# Patient Record
Sex: Male | Born: 1950 | Race: White | Hispanic: No
Health system: Southern US, Community
[De-identification: ages and names within clinical notes are randomized; demographics above are authoritative.]

---

## 2008-08-06 ENCOUNTER — Ambulatory Visit: Payer: Self-pay

## 2011-04-26 ENCOUNTER — Emergency Department: Payer: Self-pay | Admitting: Internal Medicine

## 2011-06-03 ENCOUNTER — Ambulatory Visit: Payer: Self-pay

## 2011-06-23 ENCOUNTER — Ambulatory Visit: Payer: Self-pay | Admitting: Internal Medicine

## 2011-06-28 ENCOUNTER — Ambulatory Visit: Payer: Self-pay | Admitting: Internal Medicine

## 2011-07-07 ENCOUNTER — Ambulatory Visit: Payer: Self-pay | Admitting: Internal Medicine

## 2011-07-19 ENCOUNTER — Ambulatory Visit: Payer: Self-pay | Admitting: Internal Medicine

## 2011-07-19 LAB — PLATELET COUNT: Platelet: 249 10*3/uL (ref 150–440)

## 2011-07-19 LAB — APTT: Activated PTT: 27.3 secs (ref 23.6–35.9)

## 2011-07-19 LAB — PROTIME-INR: Prothrombin Time: 13 secs (ref 11.5–14.7)

## 2011-07-27 LAB — PATHOLOGY REPORT

## 2011-08-03 ENCOUNTER — Ambulatory Visit: Payer: Self-pay | Admitting: Oncology

## 2011-08-03 LAB — CBC CANCER CENTER
Basophil #: 0 x10 3/mm (ref 0.0–0.1)
Eosinophil #: 0 x10 3/mm (ref 0.0–0.7)
Eosinophil %: 0.1 %
Lymphocyte %: 9.6 %
MCHC: 34.7 g/dL (ref 32.0–36.0)
MCV: 99 fL (ref 80–100)
Monocyte %: 8.3 %
Neutrophil #: 9.2 x10 3/mm — ABNORMAL HIGH (ref 1.4–6.5)
Neutrophil %: 81.7 %
Platelet: 288 x10 3/mm (ref 150–440)
RBC: 3.35 10*6/uL — ABNORMAL LOW (ref 4.40–5.90)
RDW: 14.1 % (ref 11.5–14.5)
WBC: 11.2 x10 3/mm — ABNORMAL HIGH (ref 3.8–10.6)

## 2011-08-03 LAB — COMPREHENSIVE METABOLIC PANEL
Albumin: 2.3 g/dL — ABNORMAL LOW (ref 3.4–5.0)
Alkaline Phosphatase: 1391 U/L — ABNORMAL HIGH (ref 50–136)
BUN: 17 mg/dL (ref 7–18)
Bilirubin,Total: 2.8 mg/dL — ABNORMAL HIGH (ref 0.2–1.0)
Chloride: 90 mmol/L — ABNORMAL LOW (ref 98–107)
Co2: 29 mmol/L (ref 21–32)
Creatinine: 1.03 mg/dL (ref 0.60–1.30)
EGFR (African American): >60
Glucose: 101 mg/dL — ABNORMAL HIGH (ref 65–99)
SGPT (ALT): 64 U/L
Total Protein: 7.7 g/dL (ref 6.4–8.2)

## 2011-08-03 LAB — PROTIME-INR: Prothrombin Time: 13.5 secs (ref 11.5–14.7)

## 2011-08-11 LAB — CBC CANCER CENTER
Bands: 3 %
Basophil #: 0 x10 3/mm (ref 0.0–0.1)
Eosinophil #: 0 x10 3/mm (ref 0.0–0.7)
Eosinophil %: 0.2 %
Eosinophil: 1 %
Lymphocyte #: 0.9 x10 3/mm — ABNORMAL LOW (ref 1.0–3.6)
Lymphocytes: 23 %
MCH: 34.3 pg — ABNORMAL HIGH (ref 26.0–34.0)
MCHC: 34.9 g/dL (ref 32.0–36.0)
MCV: 98 fL (ref 80–100)
Monocyte #: 0.1 x10 3/mm (ref 0.0–0.7)
Monocyte %: 1.7 %
Neutrophil %: 79.3 %
Platelet: 92 x10 3/mm — ABNORMAL LOW (ref 150–440)
RBC: 2.83 10*6/uL — ABNORMAL LOW (ref 4.40–5.90)
RDW: 14.5 % (ref 11.5–14.5)
WBC: 4.8 x10 3/mm (ref 3.8–10.6)

## 2011-08-11 LAB — COMPREHENSIVE METABOLIC PANEL
Albumin: 2.1 g/dL — ABNORMAL LOW (ref 3.4–5.0)
Alkaline Phosphatase: 1229 U/L — ABNORMAL HIGH (ref 50–136)
Anion Gap: 4 — ABNORMAL LOW (ref 7–16)
BUN: 28 mg/dL — ABNORMAL HIGH (ref 7–18)
Bilirubin,Total: 3.3 mg/dL — ABNORMAL HIGH (ref 0.2–1.0)
Calcium, Total: 8.4 mg/dL — ABNORMAL LOW (ref 8.5–10.1)
Creatinine: 0.81 mg/dL (ref 0.60–1.30)
Osmolality: 264 (ref 275–301)
Potassium: 4.9 mmol/L (ref 3.5–5.1)
Sodium: 129 mmol/L — ABNORMAL LOW (ref 136–145)
Total Protein: 6.9 g/dL (ref 6.4–8.2)

## 2011-08-18 LAB — CBC CANCER CENTER
Basophil %: 0.1 %
Eosinophil #: 0 x10 3/mm (ref 0.0–0.7)
Eosinophil %: 0 %
HCT: 24.7 % — ABNORMAL LOW (ref 40.0–52.0)
HGB: 8.4 g/dL — ABNORMAL LOW (ref 13.0–18.0)
MCHC: 34.2 g/dL (ref 32.0–36.0)
MCV: 100 fL (ref 80–100)
Platelet: 57 x10 3/mm — ABNORMAL LOW (ref 150–440)
RBC: 2.47 10*6/uL — ABNORMAL LOW (ref 4.40–5.90)
WBC: 25.8 x10 3/mm — ABNORMAL HIGH (ref 3.8–10.6)

## 2011-08-18 LAB — COMPREHENSIVE METABOLIC PANEL
Albumin: 1.9 g/dL — ABNORMAL LOW (ref 3.4–5.0)
Anion Gap: 10 (ref 7–16)
BUN: 51 mg/dL — ABNORMAL HIGH (ref 7–18)
Calcium, Total: 8.1 mg/dL — ABNORMAL LOW (ref 8.5–10.1)
Chloride: 98 mmol/L (ref 98–107)
Co2: 29 mmol/L (ref 21–32)
Creatinine: 1.31 mg/dL — ABNORMAL HIGH (ref 0.60–1.30)
EGFR (African American): >60
Osmolality: 290 (ref 275–301)
Potassium: 3.9 mmol/L (ref 3.5–5.1)
SGOT(AST): 100 U/L — ABNORMAL HIGH (ref 15–37)
SGPT (ALT): 64 U/L
Sodium: 137 mmol/L (ref 136–145)
Total Protein: 6.7 g/dL (ref 6.4–8.2)

## 2011-08-24 LAB — CBC CANCER CENTER
Basophil #: 0.2 x10 3/mm — ABNORMAL HIGH (ref 0.0–0.1)
Basophil %: 1.1 %
Eosinophil #: 0 x10 3/mm (ref 0.0–0.7)
HCT: 30.2 % — ABNORMAL LOW (ref 40.0–52.0)
HGB: 10.2 g/dL — ABNORMAL LOW (ref 13.0–18.0)
Lymphocyte %: 5.8 %
MCHC: 33.9 g/dL (ref 32.0–36.0)
MCV: 98 fL (ref 80–100)
Monocyte %: 4.2 %
Neutrophil %: 88.9 %
Platelet: 455 x10 3/mm — ABNORMAL HIGH (ref 150–440)
RDW: 15.2 % — ABNORMAL HIGH (ref 11.5–14.5)

## 2011-08-24 LAB — COMPREHENSIVE METABOLIC PANEL
Albumin: 1.9 g/dL — ABNORMAL LOW (ref 3.4–5.0)
Alkaline Phosphatase: 1216 U/L — ABNORMAL HIGH (ref 50–136)
Anion Gap: 7 (ref 7–16)
BUN: 14 mg/dL (ref 7–18)
Chloride: 98 mmol/L (ref 98–107)
Co2: 31 mmol/L (ref 21–32)
Creatinine: 0.81 mg/dL (ref 0.60–1.30)
EGFR (African American): >60
EGFR (Non-African Amer.): >60
Glucose: 112 mg/dL — ABNORMAL HIGH (ref 65–99)
Osmolality: 273 (ref 275–301)
SGOT(AST): 82 U/L — ABNORMAL HIGH (ref 15–37)
SGPT (ALT): 48 U/L
Sodium: 136 mmol/L (ref 136–145)

## 2011-08-30 ENCOUNTER — Ambulatory Visit: Payer: Self-pay | Admitting: Oncology

## 2011-08-31 LAB — CBC CANCER CENTER
Basophil #: 0.1 x10 3/mm (ref 0.0–0.1)
Basophil %: 0.6 %
Eosinophil #: 0 x10 3/mm (ref 0.0–0.7)
HCT: 26.5 % — ABNORMAL LOW (ref 40.0–52.0)
HGB: 9.1 g/dL — ABNORMAL LOW (ref 13.0–18.0)
Lymphocyte #: 0.4 x10 3/mm — ABNORMAL LOW (ref 1.0–3.6)
Lymphocyte %: 4.1 %
MCH: 33.9 pg (ref 26.0–34.0)
MCHC: 34.4 g/dL (ref 32.0–36.0)
Monocyte #: 0.3 x10 3/mm (ref 0.0–0.7)
Monocyte %: 3.1 %
Neutrophil #: 8.6 x10 3/mm — ABNORMAL HIGH (ref 1.4–6.5)
Neutrophil %: 92.2 %
RBC: 2.68 10*6/uL — ABNORMAL LOW (ref 4.40–5.90)
RDW: 14.9 % — ABNORMAL HIGH (ref 11.5–14.5)

## 2011-08-31 LAB — COMPREHENSIVE METABOLIC PANEL
Albumin: 1.8 g/dL — ABNORMAL LOW (ref 3.4–5.0)
Alkaline Phosphatase: 977 U/L — ABNORMAL HIGH (ref 50–136)
BUN: 18 mg/dL (ref 7–18)
Bilirubin,Total: 1.6 mg/dL — ABNORMAL HIGH (ref 0.2–1.0)
Calcium, Total: 8 mg/dL — ABNORMAL LOW (ref 8.5–10.1)
Chloride: 102 mmol/L (ref 98–107)
EGFR (African American): >60
EGFR (Non-African Amer.): >60
Glucose: 154 mg/dL — ABNORMAL HIGH (ref 65–99)
Osmolality: 284 (ref 275–301)
SGOT(AST): 83 U/L — ABNORMAL HIGH (ref 15–37)
SGPT (ALT): 57 U/L
Sodium: 140 mmol/L (ref 136–145)
Total Protein: 6.7 g/dL (ref 6.4–8.2)

## 2011-09-07 LAB — CBC CANCER CENTER
Basophil #: 0 x10 3/mm (ref 0.0–0.1)
Basophil %: 0 %
Eosinophil #: 0 x10 3/mm (ref 0.0–0.7)
HCT: 23.5 % — ABNORMAL LOW (ref 40.0–52.0)
HGB: 8 g/dL — ABNORMAL LOW (ref 13.0–18.0)
MCH: 34 pg (ref 26.0–34.0)
MCHC: 34.3 g/dL (ref 32.0–36.0)
MCV: 99 fL (ref 80–100)
Monocyte #: 0.5 x10 3/mm (ref 0.0–0.7)
Monocyte %: 2.6 %
Platelet: 68 x10 3/mm — ABNORMAL LOW (ref 150–440)
WBC: 19.3 x10 3/mm — ABNORMAL HIGH (ref 3.8–10.6)

## 2011-09-14 LAB — CBC CANCER CENTER
Basophil #: 0 x10 3/mm (ref 0.0–0.1)
Basophil %: 0.2 %
Eosinophil #: 0 x10 3/mm (ref 0.0–0.7)
Eosinophil %: 0.1 %
HCT: 27.6 % — ABNORMAL LOW (ref 40.0–52.0)
Lymphocyte #: 1.5 x10 3/mm (ref 1.0–3.6)
MCH: 32.7 pg (ref 26.0–34.0)
MCHC: 32.1 g/dL (ref 32.0–36.0)
MCV: 102 fL — ABNORMAL HIGH (ref 80–100)
Monocyte %: 7.5 %
Platelet: 561 x10 3/mm — ABNORMAL HIGH (ref 150–440)
WBC: 15.4 x10 3/mm — ABNORMAL HIGH (ref 3.8–10.6)

## 2011-09-14 LAB — COMPREHENSIVE METABOLIC PANEL
Albumin: 1.9 g/dL — ABNORMAL LOW (ref 3.4–5.0)
Bilirubin,Total: 0.5 mg/dL (ref 0.2–1.0)
Calcium, Total: 7.7 mg/dL — ABNORMAL LOW (ref 8.5–10.1)
Chloride: 103 mmol/L (ref 98–107)
Co2: 31 mmol/L (ref 21–32)
Creatinine: 0.81 mg/dL (ref 0.60–1.30)
EGFR (African American): >60
Potassium: 3.5 mmol/L (ref 3.5–5.1)
Sodium: 142 mmol/L (ref 136–145)
Total Protein: 6.9 g/dL (ref 6.4–8.2)

## 2011-09-21 LAB — COMPREHENSIVE METABOLIC PANEL
Alkaline Phosphatase: 531 U/L — ABNORMAL HIGH (ref 50–136)
Anion Gap: 7 (ref 7–16)
BUN: 13 mg/dL (ref 7–18)
Bilirubin,Total: 1.1 mg/dL — ABNORMAL HIGH (ref 0.2–1.0)
Calcium, Total: 8.1 mg/dL — ABNORMAL LOW (ref 8.5–10.1)
Co2: 34 mmol/L — ABNORMAL HIGH (ref 21–32)
Glucose: 134 mg/dL — ABNORMAL HIGH (ref 65–99)
Osmolality: 281 (ref 275–301)
Potassium: 3.8 mmol/L (ref 3.5–5.1)
SGOT(AST): 56 U/L — ABNORMAL HIGH (ref 15–37)

## 2011-09-21 LAB — CBC CANCER CENTER
Basophil #: 0.1 x10 3/mm (ref 0.0–0.1)
Eosinophil %: 0.1 %
Lymphocyte %: 2.6 %
MCH: 33.8 pg (ref 26.0–34.0)
MCV: 103 fL — ABNORMAL HIGH (ref 80–100)
Neutrophil #: 23.2 x10 3/mm — ABNORMAL HIGH (ref 1.4–6.5)
Neutrophil %: 94.8 %
Platelet: 139 x10 3/mm — ABNORMAL LOW (ref 150–440)
RBC: 2.35 10*6/uL — ABNORMAL LOW (ref 4.40–5.90)

## 2011-09-28 LAB — CBC CANCER CENTER
Basophil %: 0.7 %
Eosinophil #: 0 x10 3/mm (ref 0.0–0.7)
Eosinophil %: 0 %
HGB: 8.8 g/dL — ABNORMAL LOW (ref 13.0–18.0)
Lymphocyte #: 1 x10 3/mm (ref 1.0–3.6)
Lymphocyte %: 8.2 %
Monocyte #: 0.7 x10 3/mm (ref 0.2–1.0)
Monocyte %: 5.9 %
Neutrophil #: 10.5 x10 3/mm — ABNORMAL HIGH (ref 1.4–6.5)
Neutrophil %: 85.2 %
Platelet: 42 x10 3/mm — ABNORMAL LOW (ref 150–440)
RBC: 2.64 10*6/uL — ABNORMAL LOW (ref 4.40–5.90)
RDW: 21.9 % — ABNORMAL HIGH (ref 11.5–14.5)

## 2011-09-29 ENCOUNTER — Ambulatory Visit: Payer: Self-pay | Admitting: Oncology

## 2011-10-06 LAB — COMPREHENSIVE METABOLIC PANEL
Alkaline Phosphatase: 375 U/L — ABNORMAL HIGH (ref 50–136)
Anion Gap: 5 — ABNORMAL LOW (ref 7–16)
BUN: 9 mg/dL (ref 7–18)
Calcium, Total: 7.7 mg/dL — ABNORMAL LOW (ref 8.5–10.1)
Co2: 35 mmol/L — ABNORMAL HIGH (ref 21–32)
EGFR (Non-African Amer.): >60
Osmolality: 280 (ref 275–301)
SGOT(AST): 57 U/L — ABNORMAL HIGH (ref 15–37)
SGPT (ALT): 32 U/L

## 2011-10-06 LAB — CBC CANCER CENTER
Basophil %: 0.2 %
Eosinophil #: 0 x10 3/mm (ref 0.0–0.7)
HGB: 9.4 g/dL — ABNORMAL LOW (ref 13.0–18.0)
Lymphocyte %: 7 %
MCH: 34.2 pg — ABNORMAL HIGH (ref 26.0–34.0)
MCHC: 32.5 g/dL (ref 32.0–36.0)
Monocyte #: 0.5 x10 3/mm (ref 0.2–1.0)
RBC: 2.76 10*6/uL — ABNORMAL LOW (ref 4.40–5.90)
RDW: 24.1 % — ABNORMAL HIGH (ref 11.5–14.5)

## 2011-10-13 LAB — CBC CANCER CENTER
Eosinophil #: 0 x10 3/mm (ref 0.0–0.7)
HCT: 22.5 % — ABNORMAL LOW (ref 40.0–52.0)
Lymphocyte %: 7.2 %
MCH: 35.5 pg — ABNORMAL HIGH (ref 26.0–34.0)
MCHC: 32.9 g/dL (ref 32.0–36.0)
Monocyte #: 0.2 x10 3/mm (ref 0.2–1.0)
Monocyte %: 2.5 %
Neutrophil #: 9 x10 3/mm — ABNORMAL HIGH (ref 1.4–6.5)
RDW: 23.1 % — ABNORMAL HIGH (ref 11.5–14.5)

## 2011-10-20 LAB — CBC CANCER CENTER
Basophil #: 0 x10 3/mm (ref 0.0–0.1)
Basophil %: 0.6 %
Eosinophil #: 0 x10 3/mm (ref 0.0–0.7)
Eosinophil %: 0.1 %
HCT: 27.1 % — ABNORMAL LOW (ref 40.0–52.0)
HGB: 8.9 g/dL — ABNORMAL LOW (ref 13.0–18.0)
Lymphocyte %: 16.5 %
MCHC: 32.9 g/dL (ref 32.0–36.0)
Monocyte #: 0.2 x10 3/mm (ref 0.2–1.0)
Monocyte %: 5.9 %
Neutrophil #: 2.3 x10 3/mm (ref 1.4–6.5)
Platelet: 8 x10 3/mm — CL (ref 150–440)
RDW: 20.6 % — ABNORMAL HIGH (ref 11.5–14.5)

## 2011-10-22 LAB — CBC CANCER CENTER
Basophil #: 0.1 x10 3/mm (ref 0.0–0.1)
Basophil %: 0.9 %
Eosinophil %: 0 %
HCT: 30.3 % — ABNORMAL LOW (ref 40.0–52.0)
Lymphocyte %: 18.3 %
MCHC: 33 g/dL (ref 32.0–36.0)
MCV: 105 fL — ABNORMAL HIGH (ref 80–100)
Monocyte %: 7.5 %
Neutrophil #: 4.4 x10 3/mm (ref 1.4–6.5)
Platelet: 75 x10 3/mm — ABNORMAL LOW (ref 150–440)
RBC: 2.89 10*6/uL — ABNORMAL LOW (ref 4.40–5.90)

## 2011-10-22 LAB — COMPREHENSIVE METABOLIC PANEL
Albumin: 2.5 g/dL — ABNORMAL LOW (ref 3.4–5.0)
Alkaline Phosphatase: 261 U/L — ABNORMAL HIGH (ref 50–136)
Bilirubin,Total: 0.6 mg/dL (ref 0.2–1.0)
Chloride: 98 mmol/L (ref 98–107)
Co2: 34 mmol/L — ABNORMAL HIGH (ref 21–32)
Creatinine: 0.63 mg/dL (ref 0.60–1.30)
EGFR (African American): >60
EGFR (Non-African Amer.): >60
Osmolality: 277 (ref 275–301)
Potassium: 3.7 mmol/L (ref 3.5–5.1)
SGPT (ALT): 34 U/L
Sodium: 139 mmol/L (ref 136–145)

## 2011-10-22 LAB — PROTIME-INR: INR: 1

## 2011-10-22 LAB — APTT: Activated PTT: 26.4 secs (ref 23.6–35.9)

## 2011-10-25 ENCOUNTER — Ambulatory Visit: Payer: Self-pay | Admitting: Oncology

## 2011-10-27 LAB — CBC CANCER CENTER
Basophil #: 0 x10 3/mm (ref 0.0–0.1)
Basophil %: 0.3 %
Eosinophil %: 0.1 %
HGB: 9.8 g/dL — ABNORMAL LOW (ref 13.0–18.0)
MCH: 35.3 pg — ABNORMAL HIGH (ref 26.0–34.0)
MCHC: 33.3 g/dL (ref 32.0–36.0)
MCV: 106 fL — ABNORMAL HIGH (ref 80–100)
Monocyte %: 9.9 %
Neutrophil #: 5.6 x10 3/mm (ref 1.4–6.5)
Neutrophil %: 77.3 %
Platelet: 152 x10 3/mm (ref 150–440)
RBC: 2.77 10*6/uL — ABNORMAL LOW (ref 4.40–5.90)
RDW: 21.5 % — ABNORMAL HIGH (ref 11.5–14.5)
WBC: 7.2 x10 3/mm (ref 3.8–10.6)

## 2011-10-30 ENCOUNTER — Ambulatory Visit: Payer: Self-pay | Admitting: Oncology

## 2011-11-03 LAB — CBC CANCER CENTER
Basophil #: 0 x10 3/mm (ref 0.0–0.1)
Eosinophil #: 0 x10 3/mm (ref 0.0–0.7)
Eosinophil %: 0 %
HCT: 27.9 % — ABNORMAL LOW (ref 40.0–52.0)
HGB: 9.1 g/dL — ABNORMAL LOW (ref 13.0–18.0)
Lymphocyte #: 0.6 x10 3/mm — ABNORMAL LOW (ref 1.0–3.6)
Lymphocyte %: 2.8 %
MCH: 34.3 pg — ABNORMAL HIGH (ref 26.0–34.0)
MCHC: 32.7 g/dL (ref 32.0–36.0)
Monocyte %: 6.5 %
Neutrophil #: 19.2 x10 3/mm — ABNORMAL HIGH (ref 1.4–6.5)
Neutrophil %: 90.6 %
Platelet: 254 x10 3/mm (ref 150–440)
RBC: 2.66 10*6/uL — ABNORMAL LOW (ref 4.40–5.90)
RDW: 21 % — ABNORMAL HIGH (ref 11.5–14.5)
WBC: 21.2 x10 3/mm — ABNORMAL HIGH (ref 3.8–10.6)

## 2011-11-10 LAB — CBC CANCER CENTER
Basophil #: 0.1 x10 3/mm (ref 0.0–0.1)
Basophil %: 0.4 %
Eosinophil #: 0 x10 3/mm (ref 0.0–0.7)
Eosinophil %: 0 %
HGB: 10.2 g/dL — ABNORMAL LOW (ref 13.0–18.0)
Lymphocyte #: 0.8 x10 3/mm — ABNORMAL LOW (ref 1.0–3.6)
Lymphocyte %: 5.6 %
MCH: 35.1 pg — ABNORMAL HIGH (ref 26.0–34.0)
MCHC: 32.9 g/dL (ref 32.0–36.0)
Monocyte #: 0.9 x10 3/mm (ref 0.2–1.0)
Monocyte %: 6.1 %
Neutrophil #: 12.9 x10 3/mm — ABNORMAL HIGH (ref 1.4–6.5)
Neutrophil %: 87.9 %
WBC: 14.7 x10 3/mm — ABNORMAL HIGH (ref 3.8–10.6)

## 2011-11-17 LAB — CBC CANCER CENTER
Eosinophil #: 0 x10 3/mm (ref 0.0–0.7)
HCT: 31.5 % — ABNORMAL LOW (ref 40.0–52.0)
HGB: 10.3 g/dL — ABNORMAL LOW (ref 13.0–18.0)
MCH: 35 pg — ABNORMAL HIGH (ref 26.0–34.0)
MCV: 107 fL — ABNORMAL HIGH (ref 80–100)
Monocyte #: 0.6 x10 3/mm (ref 0.2–1.0)
Monocyte %: 5.3 %
Neutrophil %: 87.5 %
Platelet: 133 x10 3/mm — ABNORMAL LOW (ref 150–440)

## 2011-11-17 LAB — COMPREHENSIVE METABOLIC PANEL
Anion Gap: 5 — ABNORMAL LOW (ref 7–16)
BUN: 10 mg/dL (ref 7–18)
Chloride: 98 mmol/L (ref 98–107)
Co2: 33 mmol/L — ABNORMAL HIGH (ref 21–32)
EGFR (African American): >60
EGFR (Non-African Amer.): >60
Glucose: 135 mg/dL — ABNORMAL HIGH (ref 65–99)
Osmolality: 273 (ref 275–301)
Potassium: 3.3 mmol/L — ABNORMAL LOW (ref 3.5–5.1)
SGOT(AST): 36 U/L (ref 15–37)
SGPT (ALT): 22 U/L
Total Protein: 7.6 g/dL (ref 6.4–8.2)

## 2011-11-24 LAB — CBC CANCER CENTER
Eosinophil #: 0 x10 3/mm (ref 0.0–0.7)
HCT: 29.3 % — ABNORMAL LOW (ref 40.0–52.0)
HGB: 9.6 g/dL — ABNORMAL LOW (ref 13.0–18.0)
Lymphocyte #: 0.8 x10 3/mm — ABNORMAL LOW (ref 1.0–3.6)
Lymphocyte %: 2.5 %
MCH: 35.1 pg — ABNORMAL HIGH (ref 26.0–34.0)
Monocyte #: 0.7 x10 3/mm (ref 0.2–1.0)
Monocyte %: 2.2 %
Platelet: 64 x10 3/mm — ABNORMAL LOW (ref 150–440)

## 2011-11-29 ENCOUNTER — Ambulatory Visit: Payer: Self-pay | Admitting: Oncology

## 2011-12-01 LAB — CBC CANCER CENTER
Basophil #: 0 x10 3/mm (ref 0.0–0.1)
Basophil %: 0.4 %
HCT: 28.3 % — ABNORMAL LOW (ref 40.0–52.0)
HGB: 9.3 g/dL — ABNORMAL LOW (ref 13.0–18.0)
Lymphocyte #: 1 x10 3/mm (ref 1.0–3.6)
Lymphocyte %: 10.7 %
MCHC: 32.8 g/dL (ref 32.0–36.0)
MCV: 110 fL — ABNORMAL HIGH (ref 80–100)
Monocyte #: 0.3 x10 3/mm (ref 0.2–1.0)
Monocyte %: 2.7 %
Neutrophil #: 8.1 x10 3/mm — ABNORMAL HIGH (ref 1.4–6.5)
RBC: 2.58 10*6/uL — ABNORMAL LOW (ref 4.40–5.90)
RDW: 19.8 % — ABNORMAL HIGH (ref 11.5–14.5)
WBC: 9.3 x10 3/mm (ref 3.8–10.6)

## 2011-12-01 LAB — BASIC METABOLIC PANEL
Anion Gap: 10 (ref 7–16)
Calcium, Total: 8 mg/dL — ABNORMAL LOW (ref 8.5–10.1)
Chloride: 94 mmol/L — ABNORMAL LOW (ref 98–107)
Co2: 33 mmol/L — ABNORMAL HIGH (ref 21–32)
Creatinine: 0.95 mg/dL (ref 0.60–1.30)
EGFR (African American): >60
EGFR (Non-African Amer.): >60
Glucose: 111 mg/dL — ABNORMAL HIGH (ref 65–99)
Sodium: 137 mmol/L (ref 136–145)

## 2011-12-01 LAB — MAGNESIUM: Magnesium: 1 mg/dL — ABNORMAL LOW

## 2011-12-08 ENCOUNTER — Ambulatory Visit: Payer: Self-pay | Admitting: Oncology

## 2011-12-08 LAB — CBC CANCER CENTER
Basophil #: 0.1 x10 3/mm (ref 0.0–0.1)
Basophil %: 0.6 %
Eosinophil %: 0.1 %
HCT: 30 % — ABNORMAL LOW (ref 40.0–52.0)
MCH: 36 pg — ABNORMAL HIGH (ref 26.0–34.0)
MCHC: 32.1 g/dL (ref 32.0–36.0)
Monocyte #: 0.8 x10 3/mm (ref 0.2–1.0)
Monocyte %: 6.6 %
Neutrophil #: 9.9 x10 3/mm — ABNORMAL HIGH (ref 1.4–6.5)
Neutrophil %: 83.9 %
Platelet: 164 x10 3/mm (ref 150–440)
RBC: 2.67 10*6/uL — ABNORMAL LOW (ref 4.40–5.90)
RDW: 20.3 % — ABNORMAL HIGH (ref 11.5–14.5)

## 2011-12-08 LAB — COMPREHENSIVE METABOLIC PANEL
Anion Gap: 6 — ABNORMAL LOW (ref 7–16)
BUN: 11 mg/dL (ref 7–18)
Bilirubin,Total: 0.5 mg/dL (ref 0.2–1.0)
Calcium, Total: 8.5 mg/dL (ref 8.5–10.1)
Chloride: 96 mmol/L — ABNORMAL LOW (ref 98–107)
Co2: 33 mmol/L — ABNORMAL HIGH (ref 21–32)
Creatinine: 0.86 mg/dL (ref 0.60–1.30)
EGFR (African American): >60
EGFR (Non-African Amer.): >60
Osmolality: 269 (ref 275–301)
SGPT (ALT): 16 U/L
Total Protein: 8.1 g/dL (ref 6.4–8.2)

## 2011-12-08 LAB — APTT: Activated PTT: 27.1 secs (ref 23.6–35.9)

## 2011-12-15 LAB — CBC CANCER CENTER
Basophil #: 0 x10 3/mm (ref 0.0–0.1)
Basophil %: 0.5 %
Eosinophil #: 0 x10 3/mm (ref 0.0–0.7)
HCT: 35.2 % — ABNORMAL LOW (ref 40.0–52.0)
HGB: 11.5 g/dL — ABNORMAL LOW (ref 13.0–18.0)
MCH: 36.4 pg — ABNORMAL HIGH (ref 26.0–34.0)
MCHC: 32.6 g/dL (ref 32.0–36.0)
MCV: 112 fL — ABNORMAL HIGH (ref 80–100)
Monocyte #: 1.2 x10 3/mm — ABNORMAL HIGH (ref 0.2–1.0)
Neutrophil #: 7.1 x10 3/mm — ABNORMAL HIGH (ref 1.4–6.5)
Neutrophil %: 73.5 %
RDW: 18.6 % — ABNORMAL HIGH (ref 11.5–14.5)

## 2011-12-15 LAB — COMPREHENSIVE METABOLIC PANEL
Alkaline Phosphatase: 221 U/L — ABNORMAL HIGH (ref 50–136)
Bilirubin,Total: 0.6 mg/dL (ref 0.2–1.0)
Calcium, Total: 8.2 mg/dL — ABNORMAL LOW (ref 8.5–10.1)
Chloride: 96 mmol/L — ABNORMAL LOW (ref 98–107)
Co2: 34 mmol/L — ABNORMAL HIGH (ref 21–32)
Creatinine: 0.89 mg/dL (ref 0.60–1.30)
EGFR (Non-African Amer.): >60
Osmolality: 270 (ref 275–301)
SGOT(AST): 40 U/L — ABNORMAL HIGH (ref 15–37)
Sodium: 135 mmol/L — ABNORMAL LOW (ref 136–145)

## 2011-12-21 ENCOUNTER — Inpatient Hospital Stay: Payer: Self-pay | Admitting: Oncology

## 2011-12-21 LAB — CBC CANCER CENTER
Basophil #: 0.1 x10 3/mm (ref 0.0–0.1)
Basophil %: 0.5 %
HCT: 30.9 % — ABNORMAL LOW (ref 40.0–52.0)
HGB: 10 g/dL — ABNORMAL LOW (ref 13.0–18.0)
Lymphocyte %: 13.1 %
MCHC: 32.4 g/dL (ref 32.0–36.0)
MCV: 110 fL — ABNORMAL HIGH (ref 80–100)
Monocyte #: 1.1 x10 3/mm — ABNORMAL HIGH (ref 0.2–1.0)
Monocyte %: 9.5 %
Neutrophil #: 8.6 x10 3/mm — ABNORMAL HIGH (ref 1.4–6.5)
Platelet: 372 x10 3/mm (ref 150–440)
RBC: 2.8 10*6/uL — ABNORMAL LOW (ref 4.40–5.90)

## 2011-12-21 LAB — APTT: Activated PTT: 26.4 secs (ref 23.6–35.9)

## 2011-12-21 LAB — COMPREHENSIVE METABOLIC PANEL
BUN: 17 mg/dL (ref 7–18)
Bilirubin,Total: 0.6 mg/dL (ref 0.2–1.0)
Chloride: 97 mmol/L — ABNORMAL LOW (ref 98–107)
Co2: 30 mmol/L (ref 21–32)
Creatinine: 0.93 mg/dL (ref 0.60–1.30)
EGFR (African American): >60
EGFR (Non-African Amer.): >60
Osmolality: 272 (ref 275–301)
Potassium: 3.1 mmol/L — ABNORMAL LOW (ref 3.5–5.1)
SGOT(AST): 32 U/L (ref 15–37)

## 2011-12-29 LAB — CBC CANCER CENTER
Basophil #: 0 x10 3/mm (ref 0.0–0.1)
HGB: 11.5 g/dL — ABNORMAL LOW (ref 13.0–18.0)
Lymphocyte #: 1.8 x10 3/mm (ref 1.0–3.6)
MCH: 37 pg — ABNORMAL HIGH (ref 26.0–34.0)
MCV: 111 fL — ABNORMAL HIGH (ref 80–100)
Monocyte #: 0.8 x10 3/mm (ref 0.2–1.0)
Monocyte %: 12.1 %
Neutrophil #: 3.8 x10 3/mm (ref 1.4–6.5)
Neutrophil %: 58.2 %
Platelet: 196 x10 3/mm (ref 150–440)
RBC: 3.1 10*6/uL — ABNORMAL LOW (ref 4.40–5.90)
RDW: 16.5 % — ABNORMAL HIGH (ref 11.5–14.5)
WBC: 6.5 x10 3/mm (ref 3.8–10.6)

## 2011-12-29 LAB — COMPREHENSIVE METABOLIC PANEL
Alkaline Phosphatase: 216 U/L — ABNORMAL HIGH (ref 50–136)
BUN: 15 mg/dL (ref 7–18)
Calcium, Total: 7.6 mg/dL — ABNORMAL LOW (ref 8.5–10.1)
Chloride: 98 mmol/L (ref 98–107)
Co2: 32 mmol/L (ref 21–32)
EGFR (African American): >60
EGFR (Non-African Amer.): >60
SGOT(AST): 58 U/L — ABNORMAL HIGH (ref 15–37)
SGPT (ALT): 29 U/L
Total Protein: 6.9 g/dL (ref 6.4–8.2)

## 2011-12-30 ENCOUNTER — Ambulatory Visit: Payer: Self-pay | Admitting: Oncology

## 2012-01-12 LAB — CBC CANCER CENTER
Basophil #: 0 x10 3/mm (ref 0.0–0.1)
Basophil %: 0.8 %
HCT: 38.4 % — ABNORMAL LOW (ref 40.0–52.0)
HGB: 12.5 g/dL — ABNORMAL LOW (ref 13.0–18.0)
Lymphocyte #: 1.6 x10 3/mm (ref 1.0–3.6)
Lymphocyte %: 25.8 %
MCV: 109 fL — ABNORMAL HIGH (ref 80–100)
Monocyte %: 10.2 %
Neutrophil #: 3.8 x10 3/mm (ref 1.4–6.5)
RBC: 3.53 10*6/uL — ABNORMAL LOW (ref 4.40–5.90)
RDW: 16.4 % — ABNORMAL HIGH (ref 11.5–14.5)
WBC: 6.1 x10 3/mm (ref 3.8–10.6)

## 2012-01-12 LAB — COMPREHENSIVE METABOLIC PANEL
Albumin: 2 g/dL — ABNORMAL LOW (ref 3.4–5.0)
Alkaline Phosphatase: 511 U/L — ABNORMAL HIGH (ref 50–136)
Anion Gap: 5 — ABNORMAL LOW (ref 7–16)
BUN: 16 mg/dL (ref 7–18)
Bilirubin,Total: 0.6 mg/dL (ref 0.2–1.0)
Calcium, Total: 7.9 mg/dL — ABNORMAL LOW (ref 8.5–10.1)
Creatinine: 0.93 mg/dL (ref 0.60–1.30)
Glucose: 93 mg/dL (ref 65–99)
Osmolality: 262 (ref 275–301)
Sodium: 130 mmol/L — ABNORMAL LOW (ref 136–145)
Total Protein: 8.1 g/dL (ref 6.4–8.2)

## 2012-01-30 ENCOUNTER — Ambulatory Visit: Payer: Self-pay | Admitting: Oncology

## 2012-01-30 DEATH — deceased

## 2013-07-19 IMAGING — CT CT ABDOMEN WO/W CM
2 of 4 series · 13 of 32 positions shown, 18 images · IV contrast (isovue)
Comparison: None

REASON FOR EXAM: echo showed liver mass
COMMENTS:

PROCEDURE:     KCT - KCT ABDOMEN STANDARD W/WO  - June 23, 2011 [DATE]
RESULT:     History: Liver mass
TECHNIQUE: Multiple axial images of the abdomen were performed from the lung
bases to the iliac crests, with p.o. contrast and with 85 mL of Isovue 370
intravenous contrast.

[Series 5: abd 3mm w 3.0 i40f 3 · axial · 0.78mm/px · z∈[-598,-370]mm · 8 of 98 slices shown, 13 images (1 of 2)]
[im 11/98  soft-tissue]
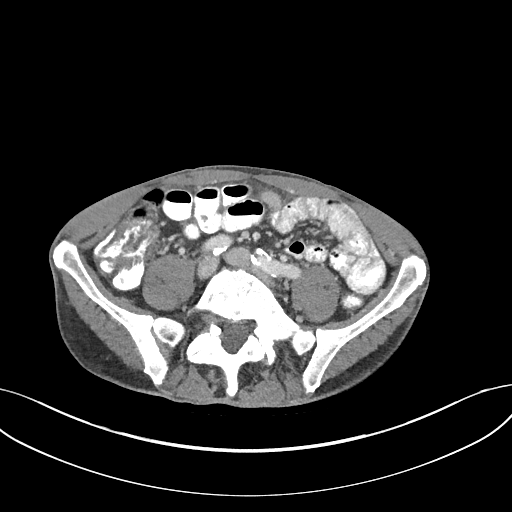
[im 11/98  bone]
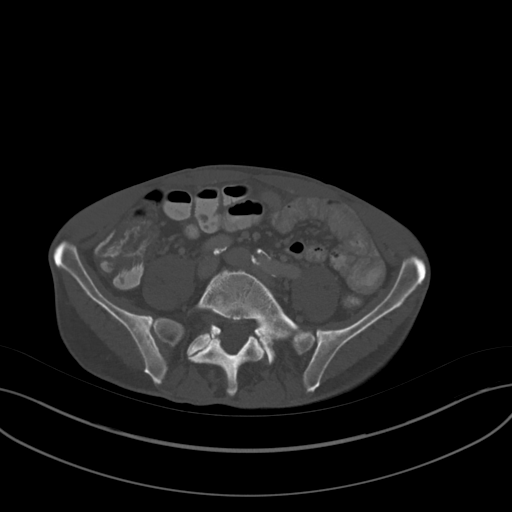
[im 22/98  soft-tissue]
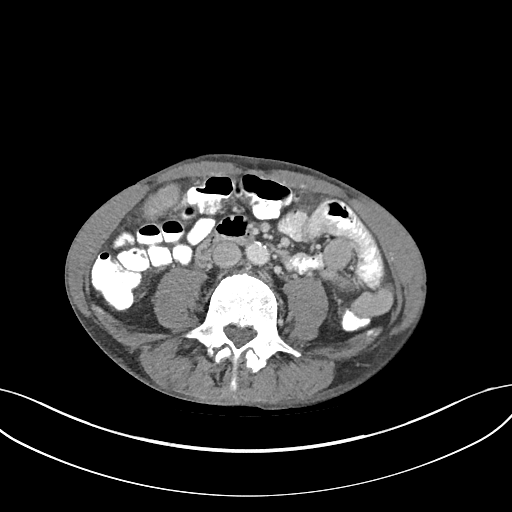
[im 33/98  soft-tissue]
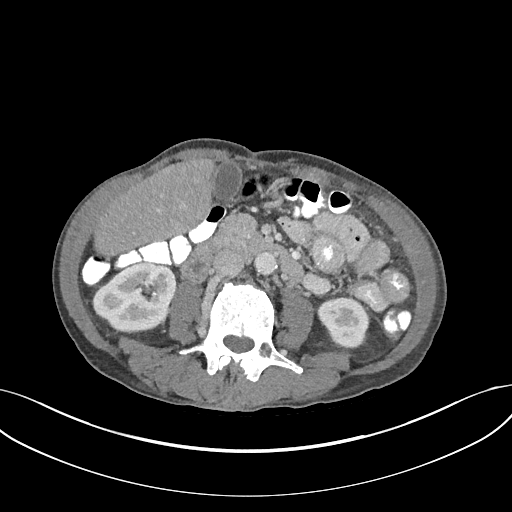
[im 44/98  soft-tissue]
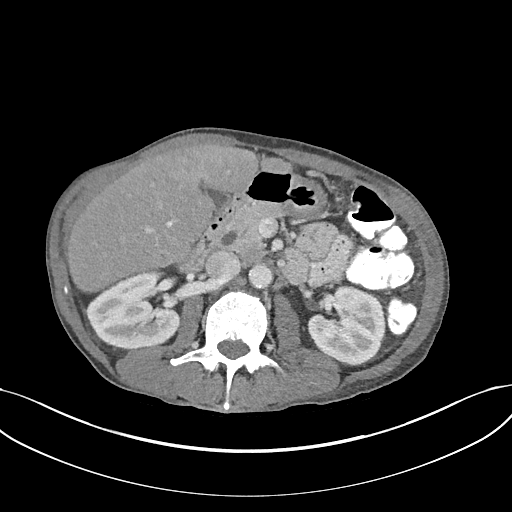
[im 54/98  soft-tissue]
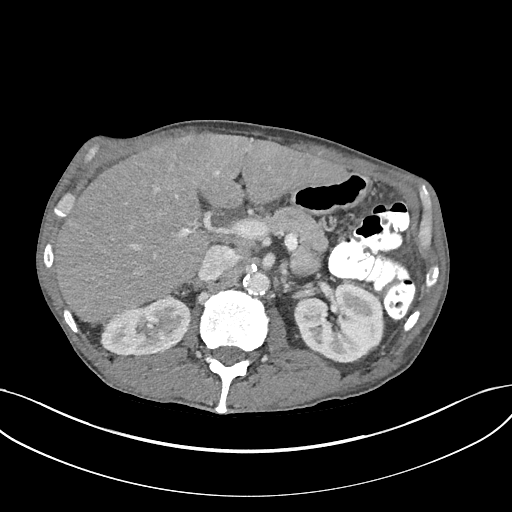
[im 54/98  lung]
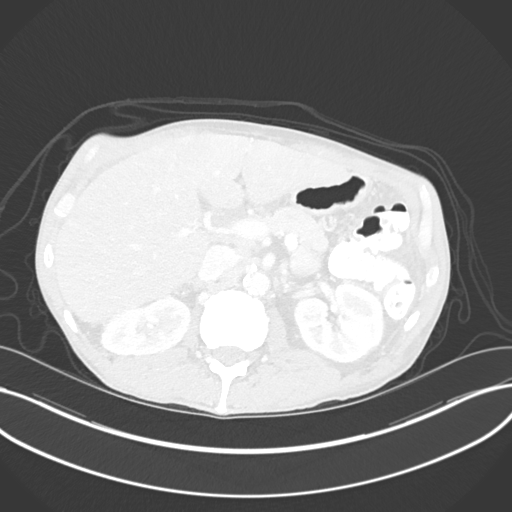
[im 65/98  soft-tissue]
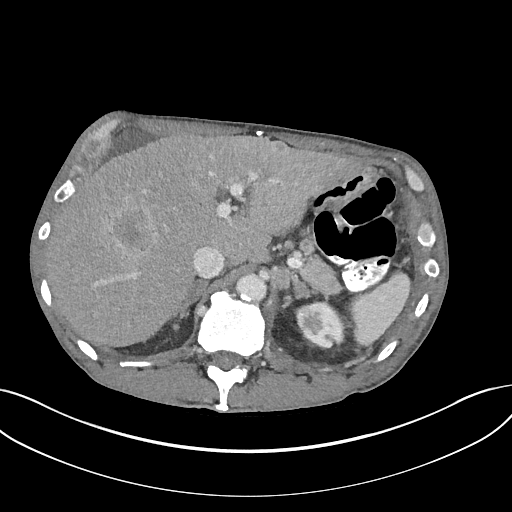
[im 65/98  lung]
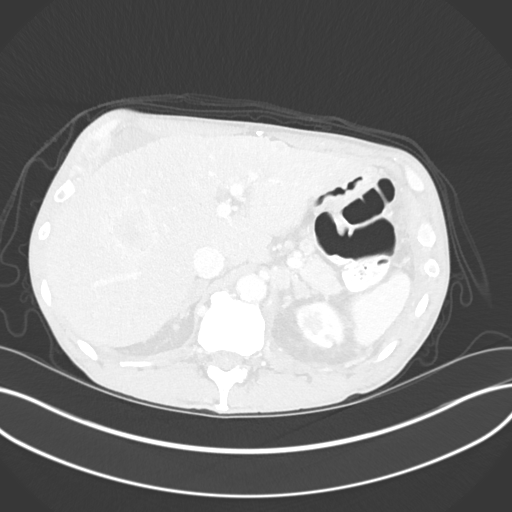
[im 76/98  soft-tissue]
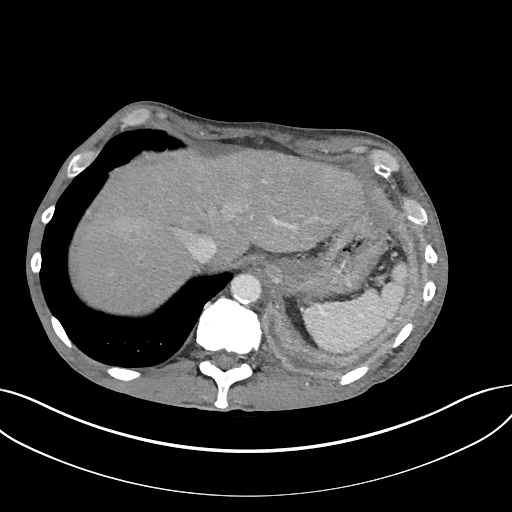
[im 76/98  lung]
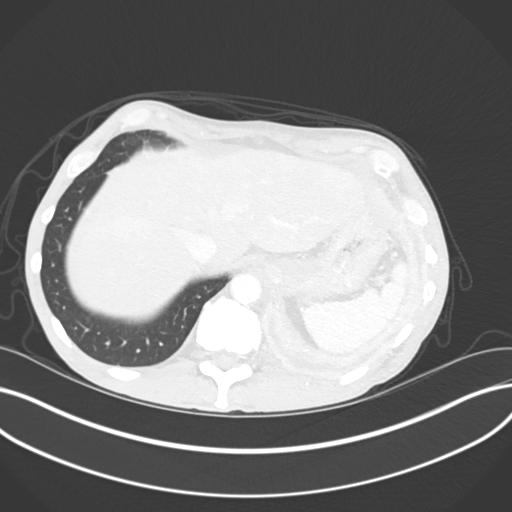
[im 87/98  soft-tissue]
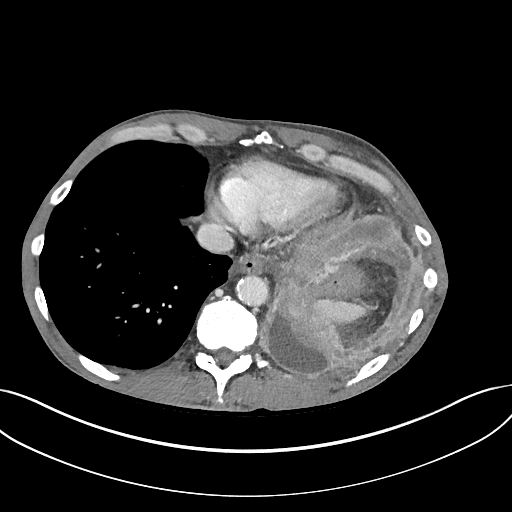
[im 87/98  lung]
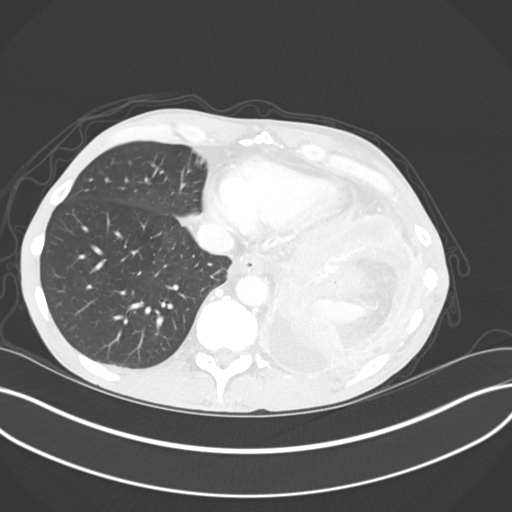

[Series 9: abd 3mm w 3.0 i40f 3 · axial · 0.78mm/px · z∈[-598,-469]mm · 5 of 98 slices shown (2 of 2)]
[im 11/98  soft-tissue]
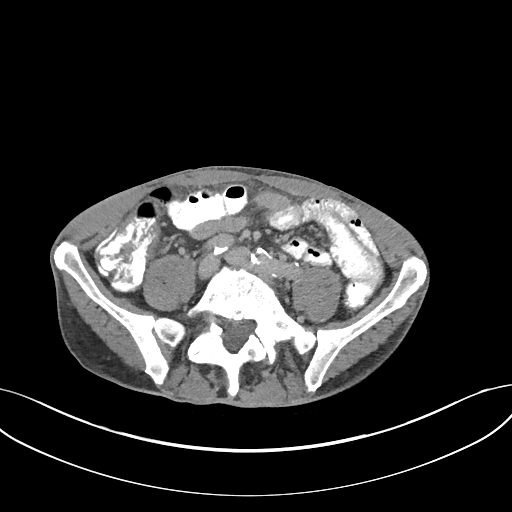
[im 22/98  soft-tissue]
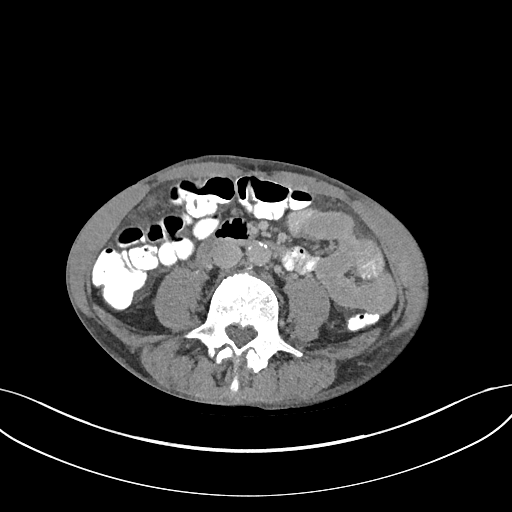
[im 33/98  soft-tissue]
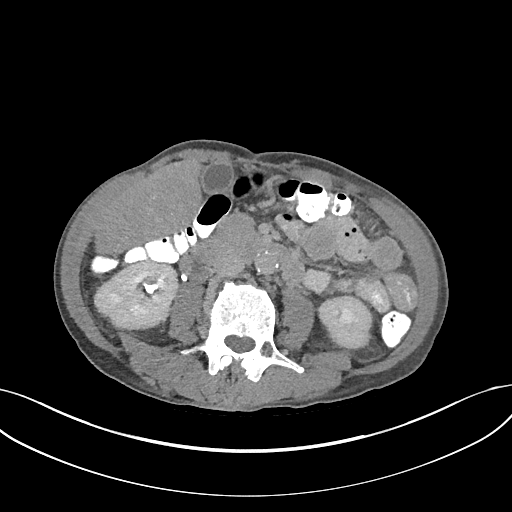
[im 44/98  soft-tissue]
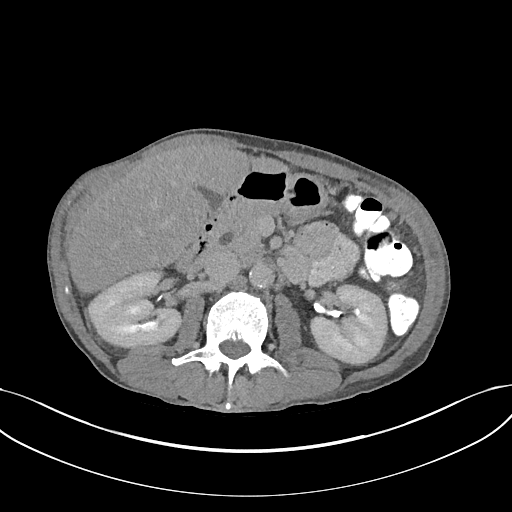
[im 54/98  soft-tissue]
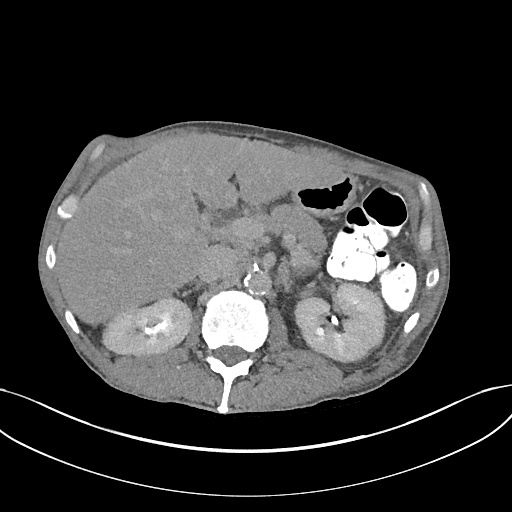

[13 of 32 positions shown; findings below may reference images not displayed]

FINDINGS: There is a large left lower lobe pulmonary mass measuring 9.6 cm. There is
irregular left pleural thickening with an associated pleural effusion
concerning for malignancy.

There is a heterogeneously enhancing 4.6 cm right hepatic lobe mass most
concerning for metastatic disease. There is a 19 mm left adrenal mass
measuring -1 Hounsfield units likely representing an adrenal adenoma. There
is no intrahepatic or extrahepatic biliary ductal dilatation. The
gallbladder is unremarkable. The spleen demonstrates no focal abnormality.
The left kidney and right adrenal gland are normal. There is a 17 mm
enhancing mass adjacent versus arising from the interpolar aspect of the
right kidney concerning for malignancy.  There is a 8mm enhancing nodule
adjacent to the upper pole of the right kidney also concerning for
malignancy. There is a 2.3 cm mass adjacent to the left adrenal gland and
abutting the left lateral aspect of the celiac trunk concerning for
metastatic disease.

There is a 11 mm cystic pancreatic body mass . There is a 2.2 cm hypodense
mass in the uncinate process of the pancreas most concerning for malignancy.

The visualized portions of the stomach, duodenum, small intestine, and large
intestine demonstrate no contrast extravasation or dilatation. There is no
pneumoperitoneum, pneumatosis, or portal venous gas. There is a small amount
of perihepatic free fluid.

The abdominal aorta is normal in caliber with atherosclerosis.

There are bilateral pars interarticular defects at L5.
IMPRESSION: 1. There is a 9.6 cm left lower lobe pulmonary mass most concerning for
malignancy. There is left irregular pleural thickening and an associated
pleural effusion also concerning for pleural malignancy. There is a
heterogeneously enhancing right hepatic lobe mass concerning for metastatic
disease. Recommend oncology consultation. The site of primary malignancy is
difficult to delineate, whether the primary malignancy is arising from the
pancreas or the left lung.

2. There is a 2.2 cm hypodense mass in the uncinate process of the pancreas
most concerning for malignancy.

3. There is a 17 mm enhancing mass adjacent versus arising from the
interpolar aspect of the right kidney concerning for malignancy. Also, there
is a 8mm enhancing nodule adjacent to the upper pole of the right kidney
also concerning for malignancy. There is a 2.3 cm mass adjacent to the left
adrenal gland and abutting the left lateral aspect of the celiac trunk
concerning for metastatic disease.

## 2013-07-24 IMAGING — CT CT CHEST W/ CM
1 series · 15 of 31 positions shown, 19 images · IV contrast (agent unspecified)
Comparison: none

REASON FOR EXAM: chest pain  cough mass left lung
COMMENTS:

PROCEDURE:     KCT - KCT CHEST WITH CONTRAST  - June 28, 2011 [DATE]
RESULT:
HISTORY: Chest pain, shortness of breath and cough.

[Series 2: chest w/ 5.0 i41f 3 · axial · 0.77mm/px · z∈[+51,+391]mm · 15 of 74 slices shown, 19 images]
[im 3/74  mediastinal]
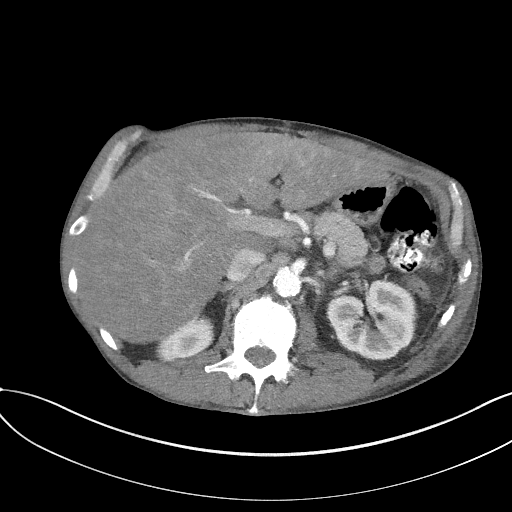
[im 3/74  lung]
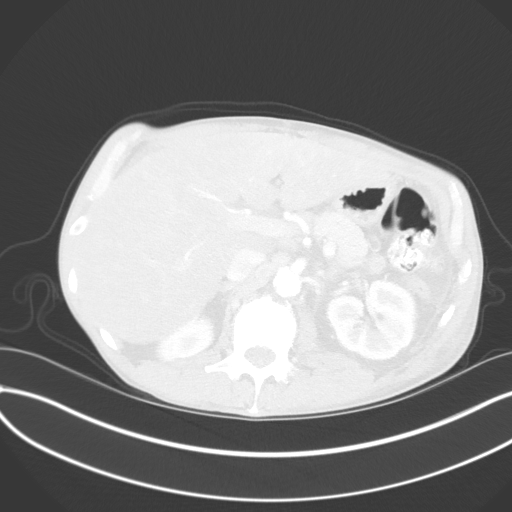
[im 9/74  lung]
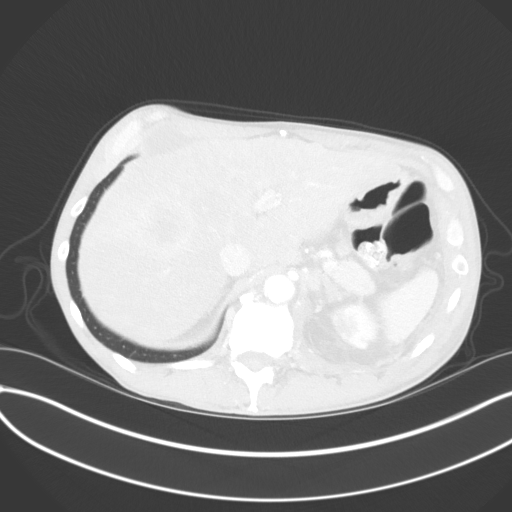
[im 14/74  lung]
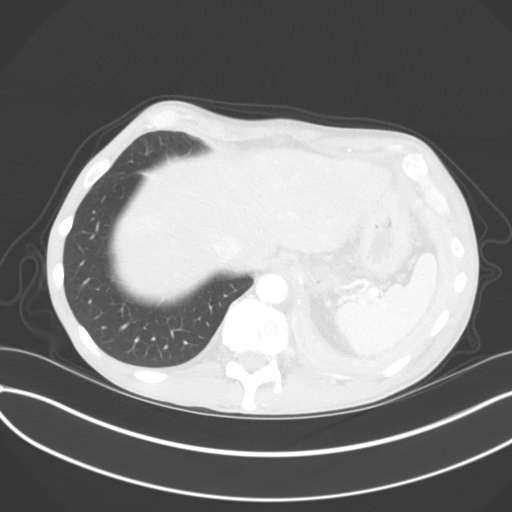
[im 17/74  lung]
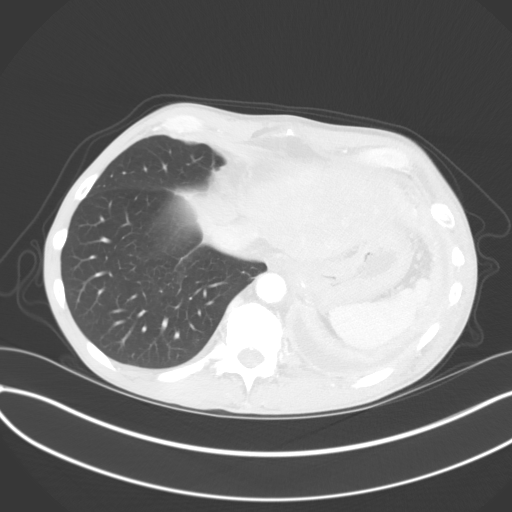
[im 22/74  mediastinal]
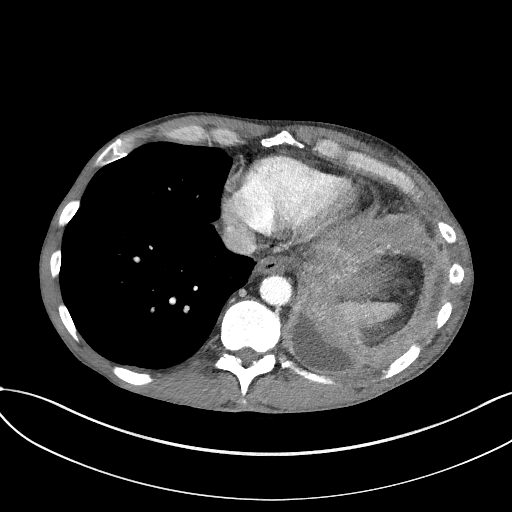
[im 22/74  lung]
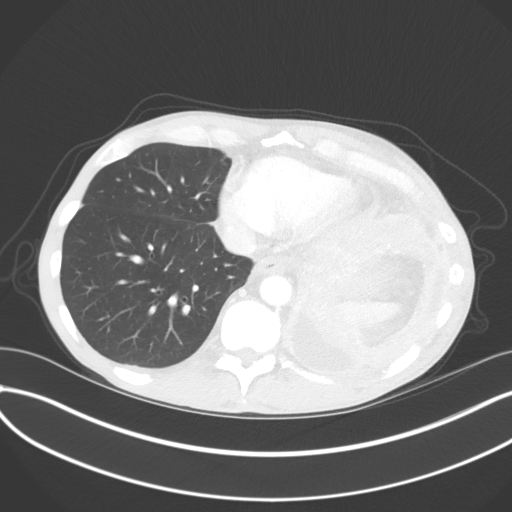
[im 28/74  lung]
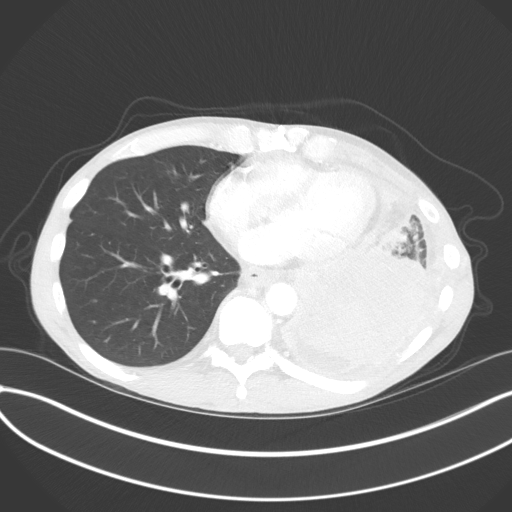
[im 33/74  lung]
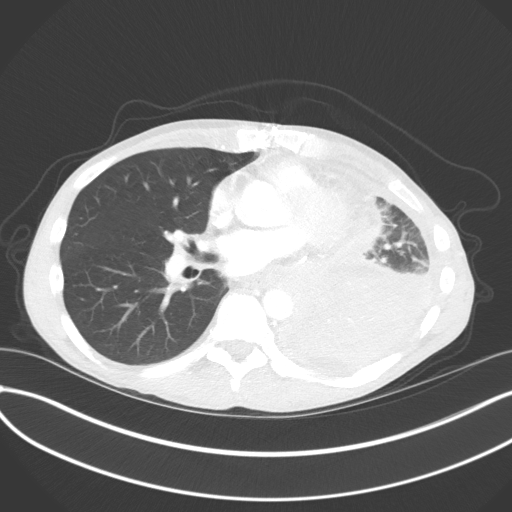
[im 38/74  lung]
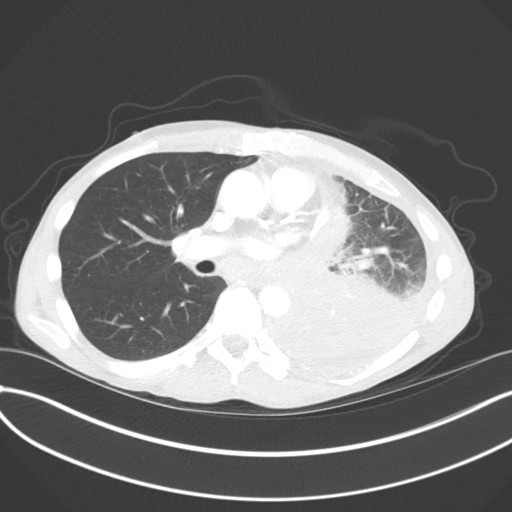
[im 41/74  mediastinal]
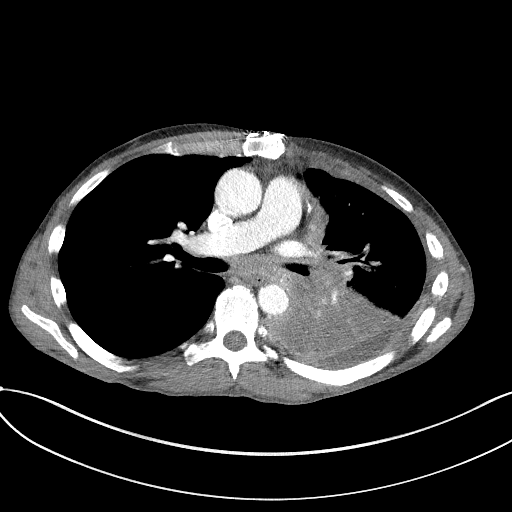
[im 41/74  lung]
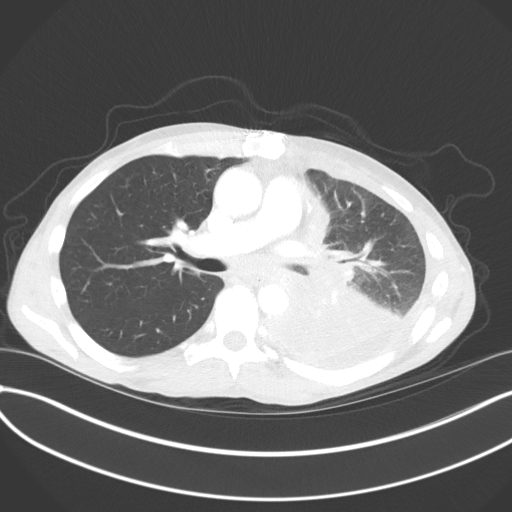
[im 46/74  lung]
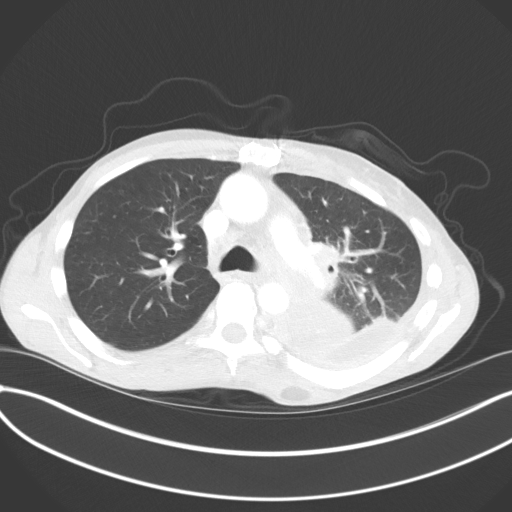
[im 52/74  lung]
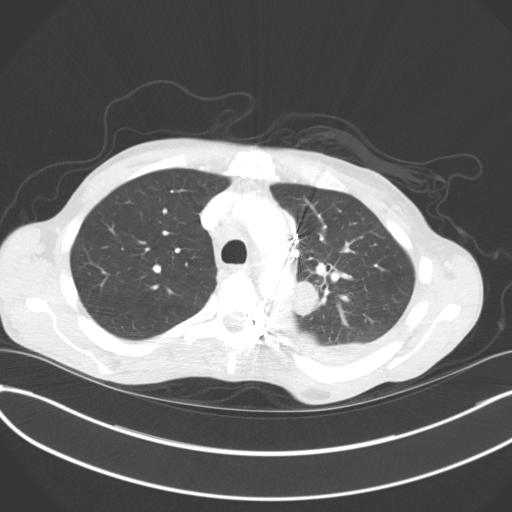
[im 57/74  lung]
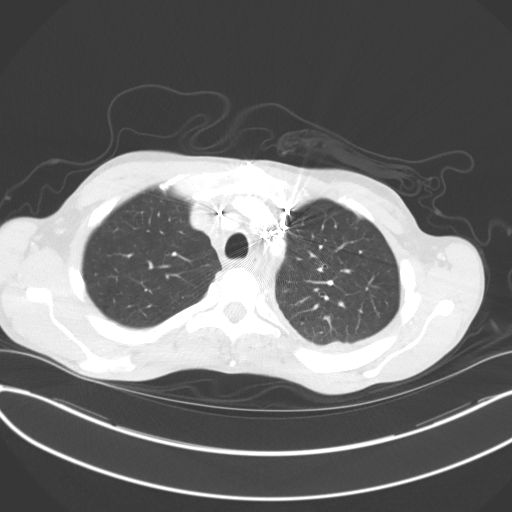
[im 60/74  mediastinal]
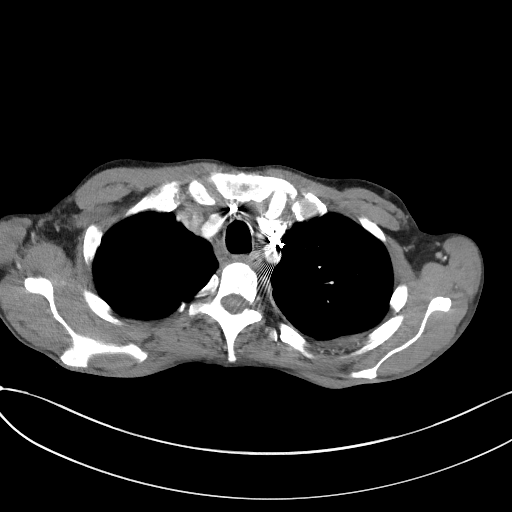
[im 60/74  lung]
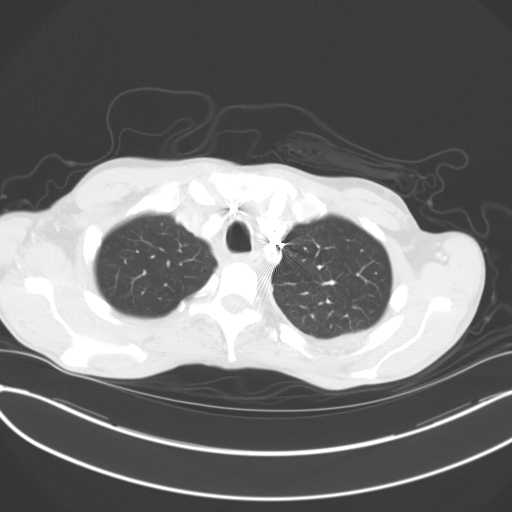
[im 65/74  lung]
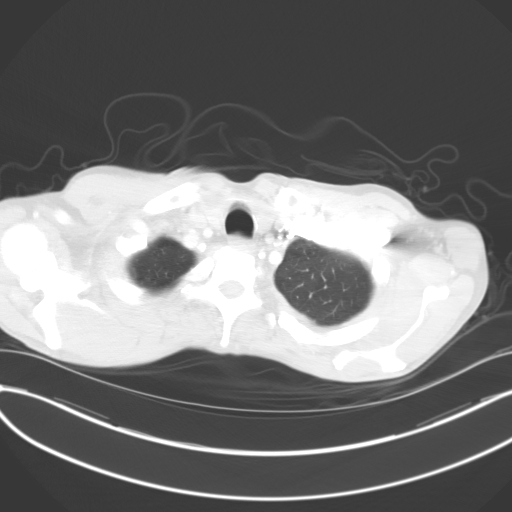
[im 71/74  lung]
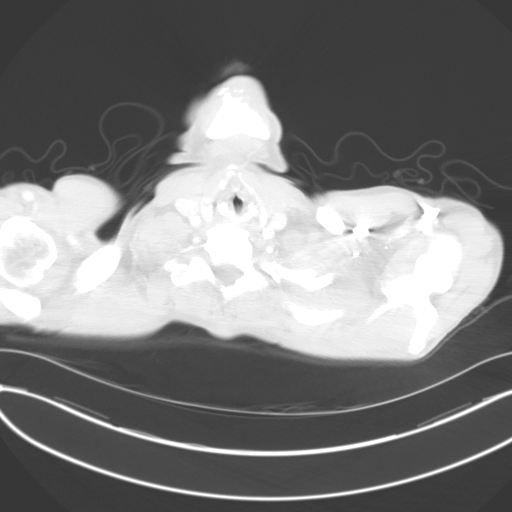

[15 of 31 positions shown; findings below may reference images not displayed]

Comparison Study: Prior chest x-ray of 06/03/2011. Following administration of
75 mL of 9sovue-1WP, chest CT was obtained. Large innumerable mediastinal
lymph nodes are noted  measuring up to 2-3 cm. A large left hilar mass is
present. Left lower lobe atelectasis and/or mass is present with small left
pleural effusion. These changes are all most likely malignant. Mild
pericardial thickening is noted. Pulmonary arteries are normal. Large
prominent 2 cm left adrenal mass is present. A 4.6 cm right hepatic lobe
mass is present. This is most likely a metastatic lesion. Large airways are
patent.
IMPRESSION: 1.     Large left hilar mass and/or left lower lobe mass with multiple large
mediastinal and hilar lymph nodes and small left pleural effusion. Prominent
lesion noted in the right lobe of the liver consistent with metastatic
disease. PET CT can be obtained for further evaluation if clinically
indicated.
2.     Coronary artery disease present.

## 2014-09-17 NOTE — Discharge Summary (Signed)
PATIENT NAME:  Devin Austin, [name] MR#:  [id number] DATE OF BIRTH:  [birth year]  DATE OF ADMISSION:  12/21/2011 DATE OF DISCHARGE:  12/24/2011  DISCHARGE DIAGNOSES:  1. 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  2. Carcinoma of lung, small cell undifferentiated tumor, metastatic disease to liver, status post multiple chemotherapy program.  3. Uncontrolled pain secondary to metastatic disease.   PROCEDURES PERFORMED:  1. Abdominal paracentesis, 5 liters of fluid removed.  2. Pleurx catheter placement.   DISCHARGE MEDICATIONS:  1. Oxycodone 10 mg every 4 hours as needed for pain. 2. Trazodone 100 mg, 1 tablet at bedtime.  3. Oxygen 3 liters by nasal cannula.  4. Spiriva 1 tablet by inhalation daily.  5. Remeron 15 mg at bedtime. 6. Megace 40 mg/ mL, 10 mL p.o. b.i.d. for appetite. 7. Colace 100 mg b.i.d. 8. Dilantin 100 mg, 2 tablets t.i.d.  9. Phenergan 25 mg, 1 tablet t.i.d. p.r.n. for pain.  10. Duragesic 75 mcg every 3 days for pain.   DISPOSITION: The patient was discharged home with Hospice care.   HISTORY OF PRESENT ILLNESS: Devin Austin is a known patient to me, was admitted with increasing abdominal distention, abdominal pain, gradually declining condition. The patient's initial complaint, past history, social and family history is documented in the chart.   HOSPITAL COURSE: During the hospital stay, the patient underwent initial abdominal paracentesis and removal of 5 liters of fluid. He started feeling somewhat stronger. His CT scan was done, and a chest x-ray and ultrasound were done during the hospital stay. The patient started reaccumulating fluid rapidly, so a Pleurx catheter was placed by Dr. Evette CristalSankar.   White count was 11.3, hemoglobin 10.9. Sodium was 135 and potassium 3.1. Electrolyte imbalance was corrected. Considering overall underlying disease of carcinoma of lung and metastatic to liver,  overall the patient's prognosis was poor. The patient also had cirrhosis of liver and previous multiple underlying medical illnesses. It was discussed with the patient and family regarding Hospice care, and they were in agreement with it. The patient was discharged home with Hospice care. The Pleurx catheter will be drained at least twice a day and as needed in between for comfort.   OVERALL PROGNOSIS: Poor.  ____________________________ Gerome SamJanak K. Sufyan Meidinger, MD jkc:[id number] D: 01/04/2012 08:09:13 ET T: 01/04/2012 11:54:38 ET JOB#: [id number]  cc: Valarie ConesJanak K. Doylene Canninghoksi, MD, <Dictator> Laddie AquasJANAK K Agustus Mane MD ELECTRONICALLY SIGNED 01/05/2012 7:45

## 2014-09-17 NOTE — Op Note (Signed)
PATIENT NAME:  Devin FrohlichHOWARD, Devin Austin MR#:  [id number] DATE OF BIRTH:  Aug 22, 1950  DATE OF PROCEDURE:  12/24/2011  PREOPERATIVE DIAGNOSIS: Malignant ascites.   POSTOPERATIVE DIAGNOSIS: Malignant ascites.   OPERATION: Insertion of a tunneled peritoneal catheter.   SURGEON: Kathreen CosierS. G. Money Mckeithan, MD   ANESTHESIA: Local with 1% Xylocaine.   COMPLICATIONS: None.   PROCEDURE: The right lower quadrant area of the abdomen was prepped and draped out as a sterile field. With ultrasound guidance the ascitic fluid was encountered in the right lower quadrant. A local anesthetic was instilled to allow for placement of a tunneled catheter. A lower incision was used for the catheter entrance to the peritoneal cavity and the upper incision 5 cm above for the exit of the catheter. The catheter was tunneled through to the lower incision site with the cuff placed underneath the skin exit site. With ultrasound guidance a needle was positioned in the ascitic fluid and a guidewire was positioned and with Seldinger technique the catheter was then placed into the peritoneal cavity. The outer sheath was removed. Lower incision was closed with a subcuticular Vicryl stitch and the opening at the exit site narrowed with a single silk stitch. The catheter was used to decompress the ascites. 5.2 liters of fluid was removed with a significant improvement in his abdominal distention. Dressing was applied. The patient tolerated the procedure well. There were no immediate problems encountered. The patient was subsequently returned to his floor in stable condition.  ____________________________ S.Wynona LunaG. Caytlyn Evers, MD sgs:drc D: 12/24/2011 18:07:50 ET T: 12/25/2011 09:54:30 ET JOB#: [id number]  cc: Timoteo ExposeS.G. Evette CristalSankar, MD, <Dictator> [facility] CorporationEEPLAPUTH Wynona LunaG Mouhamad Teed MD ELECTRONICALLY SIGNED 12/27/2011 7:59
